# Patient Record
Sex: Male | Born: 2000 | Race: White | Hispanic: Yes | Marital: Single | Smoking: Light tobacco smoker
Health system: Southern US, Community
[De-identification: ages and names within clinical notes are randomized; demographics above are authoritative.]

## PROBLEM LIST (undated history)

## (undated) DIAGNOSIS — G809 Cerebral palsy, unspecified: Secondary | ICD-10-CM

## (undated) DIAGNOSIS — G919 Hydrocephalus, unspecified: Secondary | ICD-10-CM

## (undated) DIAGNOSIS — L509 Urticaria, unspecified: Secondary | ICD-10-CM

## (undated) HISTORY — PX: SHUNT REVISION: SHX343

## (undated) HISTORY — DX: Urticaria, unspecified: L50.9

---
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
## 2020-01-01 IMAGING — CT CT HEAD WITHOUT CONTRAST
4 series · 15 of 47 positions shown, 17 images · non-contrast
Comparison: 05/14/2014

CLINICAL DATA: Intermittent headache and neck neck soreness.

EXAM:
CT HEAD WITHOUT CONTRAST
TECHNIQUE: Contiguous axial images were obtained from the base of the skull
through the vertex without intravenous contrast.

[Series 3: head without · axial · non-contrast · 0.46mm/px · z∈[+1218,+1343]mm · 7 of 35 slices shown, 9 images]
[im 5/35  brain]
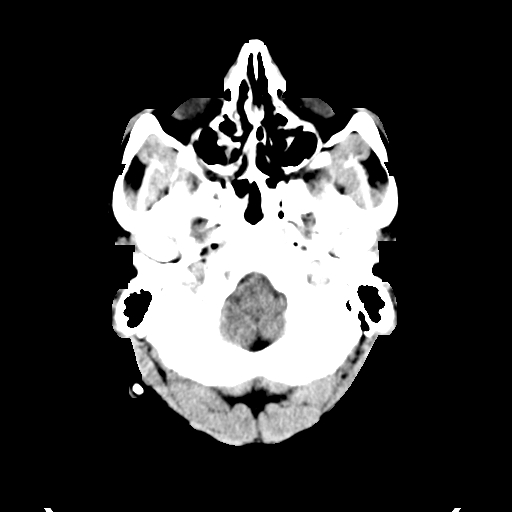
[im 5/35  bone]
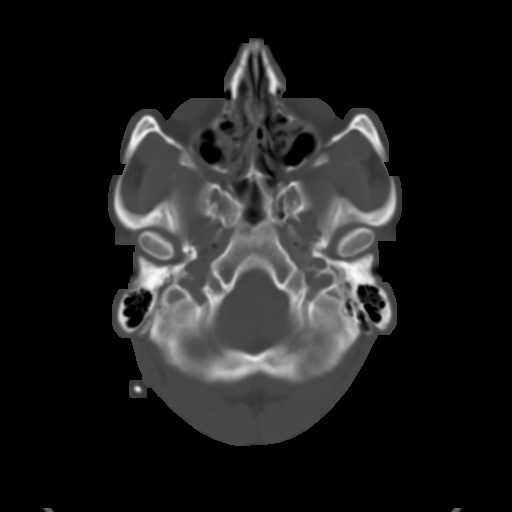
[im 9/35  brain]
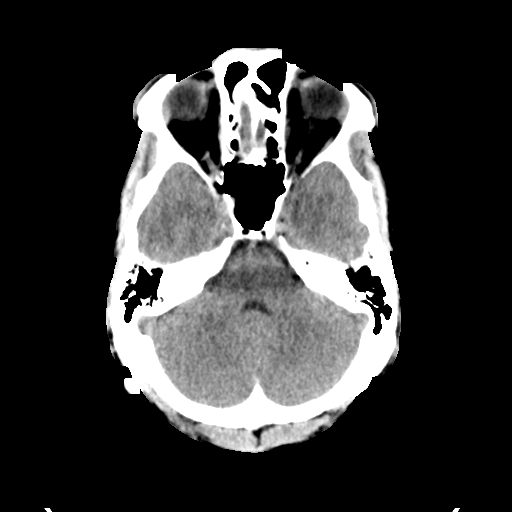
[im 13/35  brain]
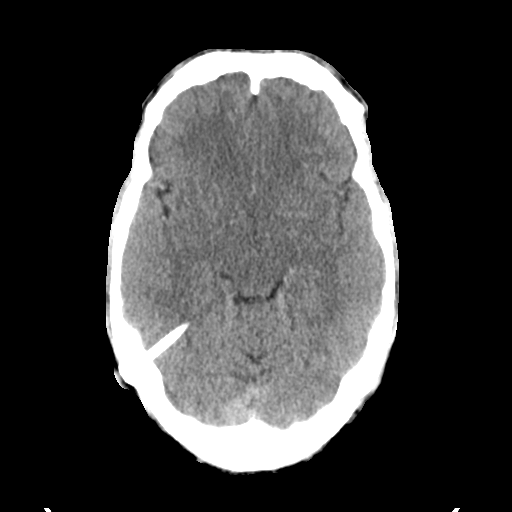
[im 18/35  brain]
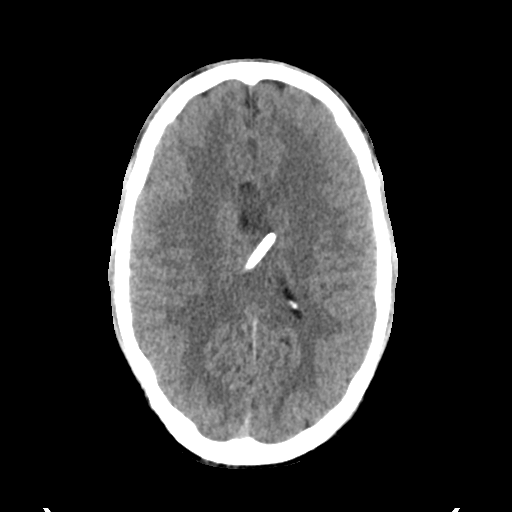
[im 22/35  brain]
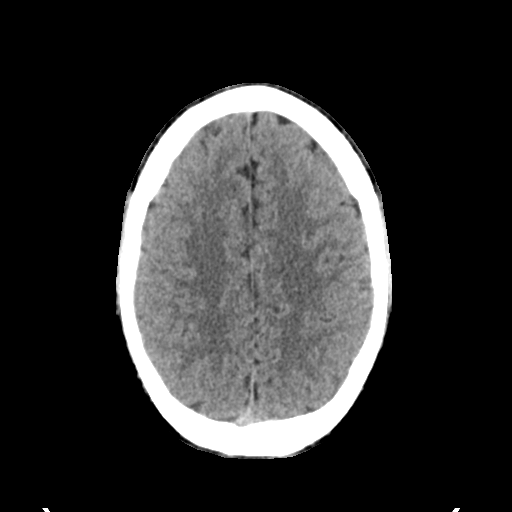
[im 22/35  bone]
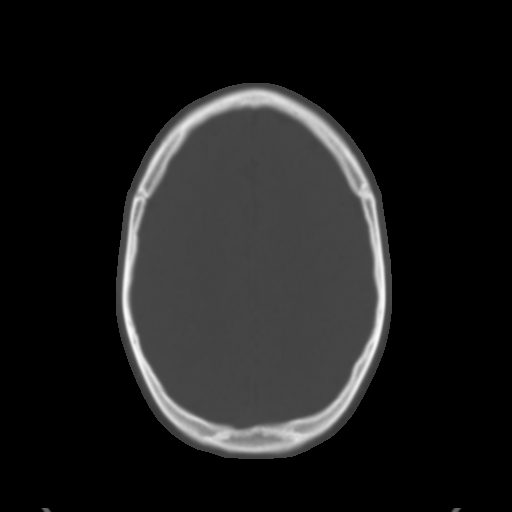
[im 26/35  brain]
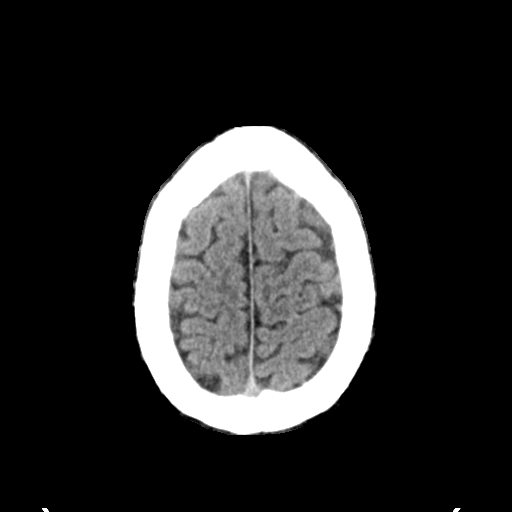
[im 30/35  brain]
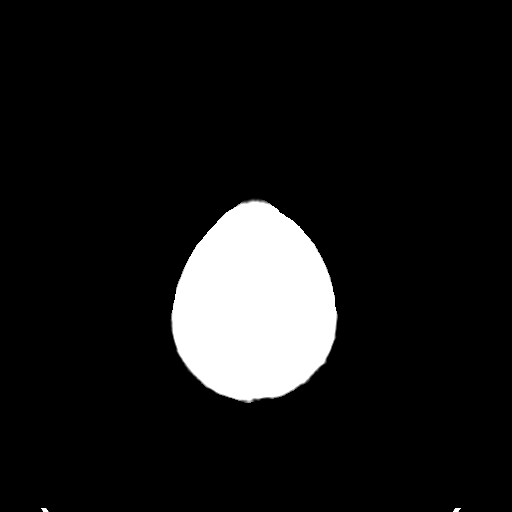

[Series 4: head bone · axial · 0.46mm/px · z∈[+1214,+1232]mm · 2 of 86 slices shown]
[im 9/86  bone]
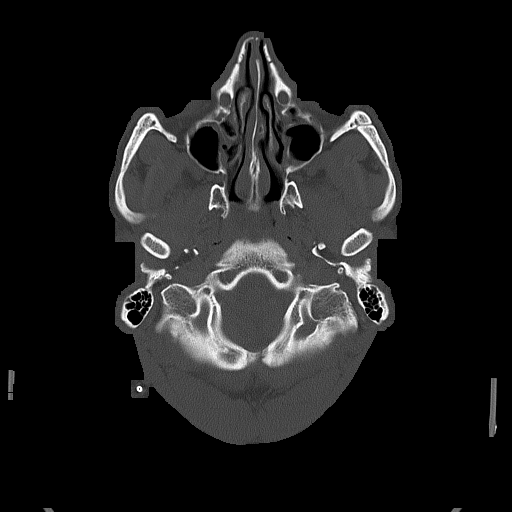
[im 18/86  bone]
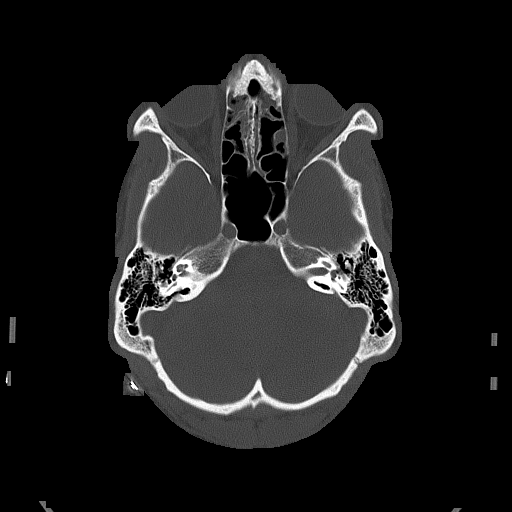

[Series 5: head without cor · coronal · non-contrast · 0.33mm/px · 3 of 77 slices shown]
[im 26/77  brain]
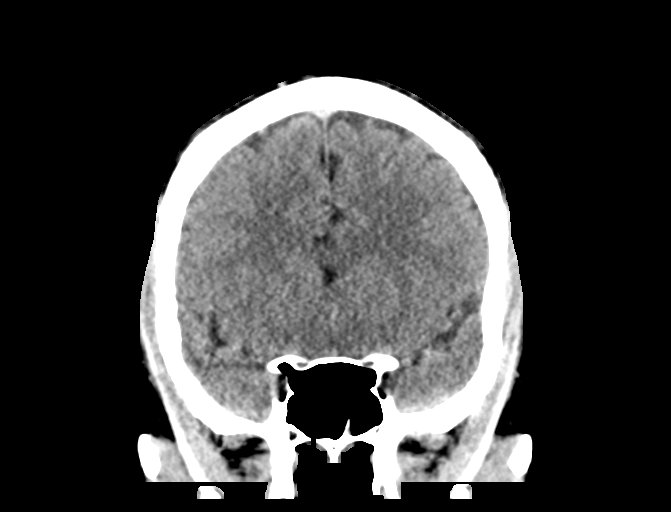
[im 34/77  brain]
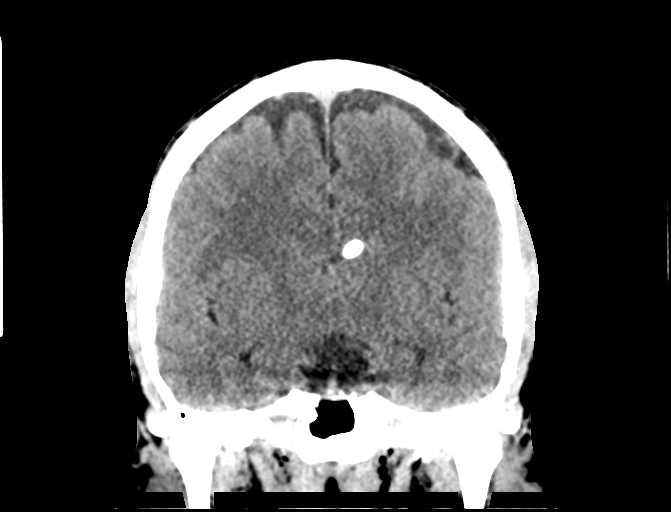
[im 43/77  brain]
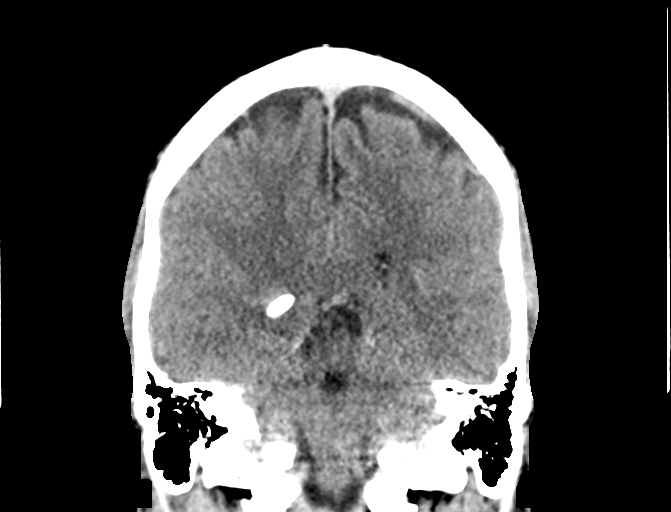

[Series 6: head without sag · sagittal · non-contrast · 0.33mm/px · 3 of 66 slices shown]
[im 22/66  brain]
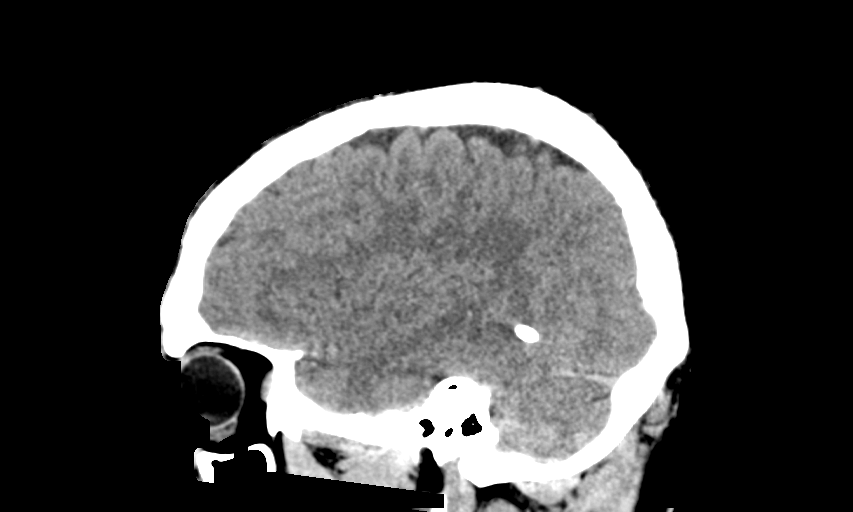
[im 33/66  brain]
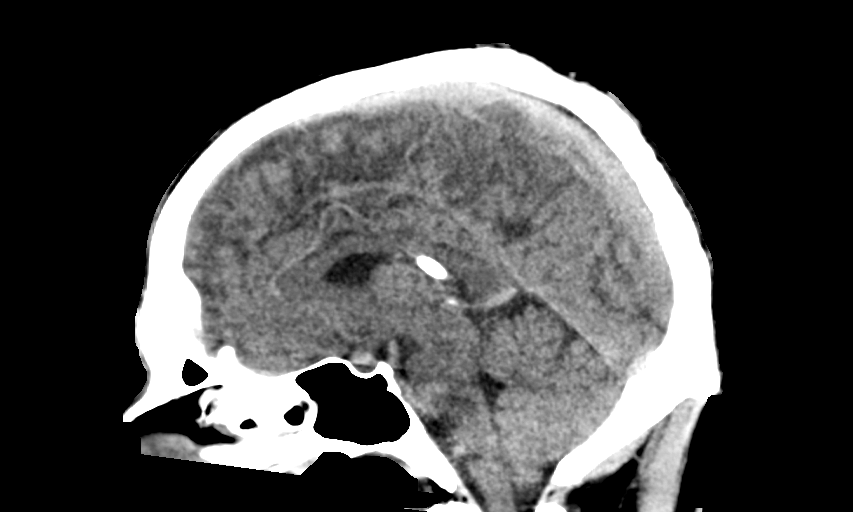
[im 44/66  brain]
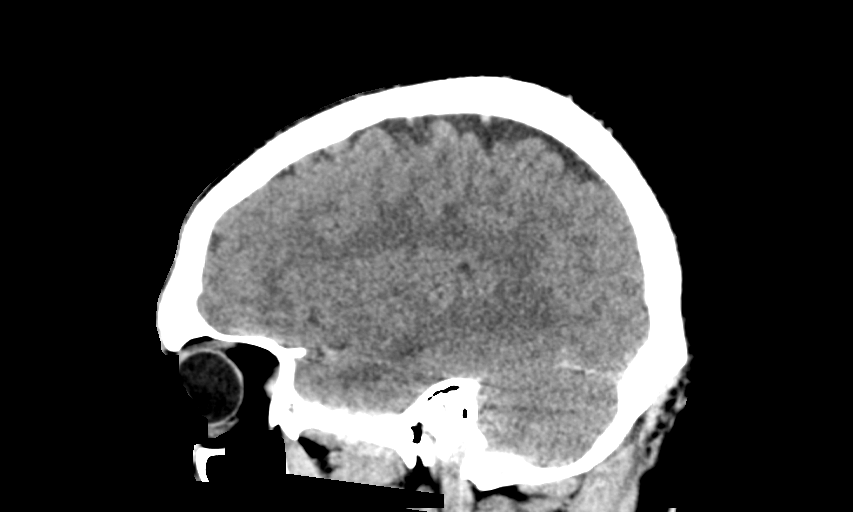

[15 of 47 positions shown; findings below may reference images not displayed]

FINDINGS: Brain: No evidence of acute infarction, hemorrhage, hydrocephalus,
extra-axial collection or mass lesion/mass effect. One of the
ventriculostomy catheters has been removed. The second catheter is
in stable position. The ventricles are decompressed as before.

Vascular: No hyperdense vessel or unexpected calcification.

Skull: Normal. Negative for fracture or focal lesion.

Sinuses/Orbits: Diffuse polypoid mucosal thickening of the ethmoid
sinuses and less so maxillary sinuses.

Other: None.
IMPRESSION: 1. No acute intracranial abnormality.
2. Interval removal of one of the ventriculostomy catheters. The
ventricles are decompressed as before.
3. Ethmoid and maxillary sinusitis.

## 2021-02-10 ENCOUNTER — Other Ambulatory Visit: Payer: Self-pay

## 2021-02-10 ENCOUNTER — Emergency Department (HOSPITAL_COMMUNITY): Payer: Medicaid Other

## 2021-02-10 ENCOUNTER — Encounter (HOSPITAL_COMMUNITY): Payer: Self-pay | Admitting: *Deleted

## 2021-02-10 ENCOUNTER — Emergency Department (HOSPITAL_COMMUNITY)
Admission: EM | Admit: 2021-02-10 | Discharge: 2021-02-10 | Disposition: A | Discharge status: Home / Self Care | Payer: Medicaid Other | Attending: Emergency Medicine | Admitting: Emergency Medicine

## 2021-02-10 DIAGNOSIS — S6992XA Unspecified injury of left wrist, hand and finger(s), initial encounter: Secondary | ICD-10-CM | POA: Diagnosis present

## 2021-02-10 DIAGNOSIS — S61217A Laceration without foreign body of left little finger without damage to nail, initial encounter: Secondary | ICD-10-CM | POA: Insufficient documentation

## 2021-02-10 DIAGNOSIS — W275XXA Contact with paper-cutter, initial encounter: Secondary | ICD-10-CM | POA: Insufficient documentation

## 2021-02-10 DIAGNOSIS — Y990 Civilian activity done for income or pay: Secondary | ICD-10-CM | POA: Diagnosis not present

## 2021-02-10 DIAGNOSIS — S61215A Laceration without foreign body of left ring finger without damage to nail, initial encounter: Secondary | ICD-10-CM | POA: Diagnosis not present

## 2021-02-10 DIAGNOSIS — Z5321 Procedure and treatment not carried out due to patient leaving prior to being seen by health care provider: Secondary | ICD-10-CM | POA: Insufficient documentation

## 2021-02-10 MED ORDER — TETANUS-DIPHTH-ACELL PERTUSSIS 5-2.5-18.5 LF-MCG/0.5 IM SUSY: 0.5000 mL | PREFILLED_SYRINGE | Freq: Once | INTRAMUSCULAR | Status: DC

## 2021-02-10 NOTE — ED Triage Notes (Signed)
Pt reports cutting left ring and little finger with machine at work. Swelling noted and abrasions. Bleeding controlled. Last tetanus > 103yrs.

## 2021-02-10 NOTE — ED Provider Triage Note (Signed)
Emergency Medicine Provider Triage Evaluation Note  Troy Randolph , a 21 y.o. male  was evaluated in triage.  Pt complains of left hand finger lacerations 12 hours ago (7/8pm 2/8). Cut with box cutter. Not bleeding at this time. No other cuts. No numbness or weakness he states  Review of Systems  Positive: laceration Negative: Fever   Physical Exam  BP (!) 148/89 (BP Location: Right Arm)    Pulse (!) 110    Temp 98.7 F (37.1 C) (Oral)    Resp 17    SpO2 98%  Gen:   Awake, no distress   Resp:  Normal effort  MSK:   Moves extremities without difficulty  Other:  Left hand 4th and 5th finger with laceration + skin avulsion  Medical Decision Making  Medically screening exam initiated at 8:12 AM.  Appropriate orders placed.  Troy Randolph was informed that the remainder of the evaluation will be completed by another provider, this initial triage assessment does not replace that evaluation, and the importance of remaining in the ED until their evaluation is complete.  TDAP will need updating. Xray.    Troy Randolph, [place] 02/10/21 [phone]

## 2021-02-11 ENCOUNTER — Ambulatory Visit (HOSPITAL_COMMUNITY)
Admission: EM | Admit: 2021-02-11 | Discharge: 2021-02-11 | Disposition: A | Discharge status: Home / Self Care | Payer: Medicaid Other | Attending: Student | Admitting: Student

## 2021-02-11 ENCOUNTER — Other Ambulatory Visit: Payer: Self-pay

## 2021-02-11 ENCOUNTER — Encounter (HOSPITAL_COMMUNITY): Payer: Self-pay | Admitting: Emergency Medicine

## 2021-02-11 DIAGNOSIS — T148XXA Other injury of unspecified body region, initial encounter: Secondary | ICD-10-CM

## 2021-02-11 DIAGNOSIS — Z23 Encounter for immunization: Secondary | ICD-10-CM | POA: Diagnosis not present

## 2021-02-11 MED ORDER — TETANUS-DIPHTH-ACELL PERTUSSIS 5-2.5-18.5 LF-MCG/0.5 IM SUSY
0.5000 mL | PREFILLED_SYRINGE | Freq: Once | INTRAMUSCULAR | Status: AC
  Administered 2021-02-11: 0.5 mL via INTRAMUSCULAR

## 2021-02-11 MED ORDER — CEPHALEXIN 500 MG PO CAPS: 500.0000 mg | ORAL_CAPSULE | Freq: Four times a day (QID) | ORAL | 0 refills | Status: AC

## 2021-02-11 MED ORDER — TETANUS-DIPHTH-ACELL PERTUSSIS 5-2.5-18.5 LF-MCG/0.5 IM SUSY
PREFILLED_SYRINGE | INTRAMUSCULAR | Status: AC
  Filled 2021-02-11: qty 0.5

## 2021-02-11 NOTE — ED Provider Notes (Signed)
[facility]    CSN: [id number] Arrival date & time: 02/11/21  0900      History   Chief Complaint Chief Complaint  Patient presents with   Finger Injury    HPI Troy Randolph is a 21 y.o. male presenting with skin avulsion for 2 days - L hand.  Medical history noncontributory.  States that he works in [employer], and a machine that he was using caught on his fingers and tore the skin away.  States that this is not painful at all, but the bleeding was significant, so he presented to the emergency department.  He did not stay to be seen.  Has been caring for this using hydrogen peroxide daily.  States he can move his fingers without issue.  He is left-handed.  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 Authorizing Provider  cephALEXin (KEFLEX) 500 MG capsule Take 1 capsule (500 mg total) by mouth 4 (four) times daily. 02/11/21  Yes Rhys Martini, PA-C    Family History Family History  Problem Relation Age of Onset   Healthy Mother    Healthy Father     Social History Social History   Tobacco Use   Smoking status: Light Smoker   Smokeless tobacco: Never  Vaping Use   Vaping Use: Never used  Substance Use Topics   Alcohol use: Yes   Drug use: No     Allergies   Patient has no known allergies.   Review of Systems Review of Systems  Skin:  Positive for wound.  All other systems reviewed and are negative.   Physical Exam Triage Vital Signs ED Triage Vitals  Enc Vitals Group     BP 02/11/21 0958  126/76     Pulse Rate 02/11/21 0958 78     Resp 02/11/21 0958 16     Temp 02/11/21 0958 98.3 F (36.8 C)     Temp Source 02/11/21 0958 Oral     SpO2 02/11/21 0958 100 %     Weight 02/11/21 0957 164 lb 14.5 oz (74.8 kg)     Height 02/11/21 0957 5\' 8"  (1.727 m)     Head Circumference --      Peak Flow --      Pain Score 02/11/21 0957 0     Pain Loc --      Pain Edu? --      Excl. in GC? --    No data found.  Updated Vital Signs BP 126/76 (BP Location: Left Arm)    Pulse 78    Temp 98.3 F (36.8 C) (Oral)    Resp 16    Ht 5\' 8"  (1.727 m)    Wt 164 lb 14.5 oz (74.8 kg)    SpO2 100%    BMI 25.07 kg/m   Visual Acuity Right Eye Distance:   Left Eye Distance:   Bilateral Distance:    Right Eye Near:   Left Eye Near:  Bilateral Near:     Physical Exam Vitals reviewed.  Constitutional:      General: He is not in acute distress.    Appearance: Normal appearance. He is not ill-appearing.  HENT:     Head: Normocephalic and atraumatic.  Pulmonary:     Effort: Pulmonary effort is normal.  Skin:    Findings: Wound present.     Comments: See images below L little finger with skin avulsion overlying dorsal PIP joint. NO tenderness of the wound or of PIP/DIP joint. Cap refill <2 seconds.  L ring finger with two skin avulsions. One 25mm avulsion overlying palmar PIP joint. One small avulsion lateral aspect of PIP joint. NO tenderness of the wound or of PIP/DIP joint. Cap refill <2 seconds.  Grip strength 4/5 and can make a fist without pain. Radial pulse 2+. Bleeding is controlled.  Neurological:     General: No focal deficit present.     Mental Status: He is alert and oriented to person, place, and time.  Psychiatric:        Mood and Affect: Mood normal.        Behavior: Behavior normal.        Thought Content: Thought content normal.        Judgment: Judgment normal.        UC Treatments / Results  Labs (all labs ordered are listed, but only abnormal results are  displayed) Labs Reviewed - No data to display  EKG   Radiology DG Hand Complete Left  Result Date: 02/10/2021 CLINICAL DATA:  lacerations of 4th/5th finger EXAM: LEFT HAND - COMPLETE 3+ VIEW COMPARISON:  Left hand radiograph 03/16/2011 FINDINGS: There is no evidence of acute fracture or dislocation. There are soft tissue injuries of the fourth and fifth digits. There are visible punctate radiopaque densities overlying the volar radial soft tissues of the fourth and fifth digits at the level of the middle phalanges. IMPRESSION: Soft tissue injuries of the fourth and fifth digits, with visible radiopaque densities along the volar radial soft tissues at the level of the middle phalanges, possibly foreign bodies. No evidence of underlying acute fracture. Electronically Signed   By: Caprice Renshaw M.D.   On: 02/10/2021 08:54    Procedures Procedures (including critical care time)  Medications Ordered in UC Medications  Tdap (BOOSTRIX) injection 0.5 mL (has no administration in time range)    Initial Impression / Assessment and Plan / UC Course  I have reviewed the triage vital signs and the nursing notes.  Pertinent labs & imaging results that were available during my care of the patient were reviewed by me and considered in my medical decision making (see chart for details).     This patient is a very pleasant 21 y.o. year old male presenting with multiple skin avulsions L hand x2 days. Afebrile, nontachy, neurovascularly intact.   Wounds present x2 days, no active bleeding.  Tdap administered. There is no bony tenderness, ROM fingers is intact. Patient declines xray which I am in agreement with.  Keflex sent given extent of wound and patient has not washed this since injury (has been using hydrogen peroxide).   ED return precautions discussed. Patient verbalizes understanding and agreement.    Final Clinical Impressions(s) / UC Diagnoses   Final diagnoses:  Skin avulsion  Need for  Tdap vaccination     Discharge Instructions      -Start the antibiotic: Keflex, 4x daily x5 days. You can take this with food if you  have a sensitive stomach. -Wash your wound with gentle soap and water 1-2 times daily.  Let air dry or gently pat. You can follow with over-the-counter neosporin ointment (or similar). Keep wrapped during the day or when you're doing something that could get it dirty (working, [employer], cooking, Catering manager). Avoid cleansing with hydrogen peroxide or alcohol!! -Seek additional medical attention if the wound is getting worse instead of better- redness increasing in size, pain getting worse, new/worsening discharge, new fevers/chills, etc.    ED Prescriptions     Medication Sig Dispense Auth. Provider   cephALEXin (KEFLEX) 500 MG capsule Take 1 capsule (500 mg total) by mouth 4 (four) times daily. 20 capsule Rhys Martini, PA-C      PDMP not reviewed this encounter.   Rhys Martini, PA-C 02/11/21 1035

## 2021-02-11 NOTE — ED Triage Notes (Signed)
Pt reports cutting left ring and pinky finger with machine at work. Pt went to [facility] ED yesterday and left due to long wait times.  Bleeding controlled.

## 2021-02-11 NOTE — Discharge Instructions (Addendum)
-  Start the antibiotic: Keflex, 4x daily x5 days. You can take this with food if you have a sensitive stomach. ?-Wash your wound with gentle soap and water 1-2 times daily.  Let air dry or gently pat. You can follow with over-the-counter neosporin ointment (or similar). Keep wrapped during the day or when you're doing something that could get it dirty (working, yardwork, cooking, etc). Avoid cleansing with hydrogen peroxide or alcohol!! ?-Seek additional medical attention if the wound is getting worse instead of better- redness increasing in size, pain getting worse, new/worsening discharge, new fevers/chills, etc. ? ?

## 2022-11-21 IMAGING — DX DG HAND COMPLETE 3+V*L*
3 series · 3 of 3 positions shown · non-contrast
Comparison: Left hand radiograph 03/16/2011

CLINICAL DATA: lacerations of 4th/5th finger

EXAM:
LEFT HAND - COMPLETE 3+ VIEW

[x hand pa left]
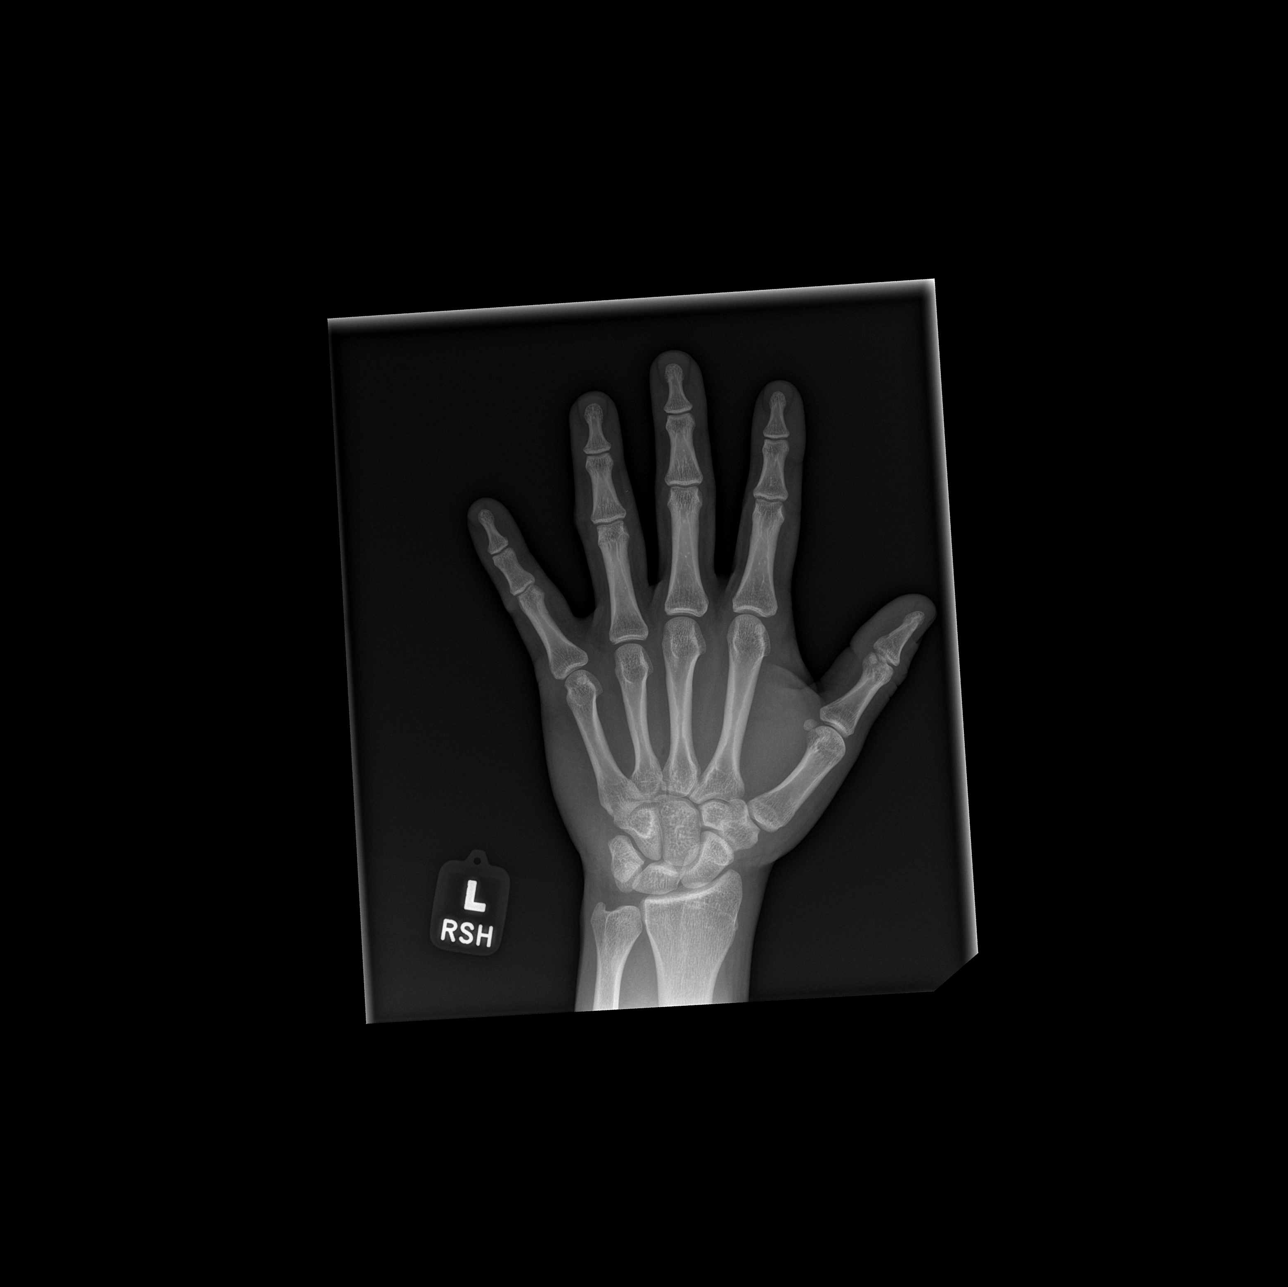

[x hand obl left]
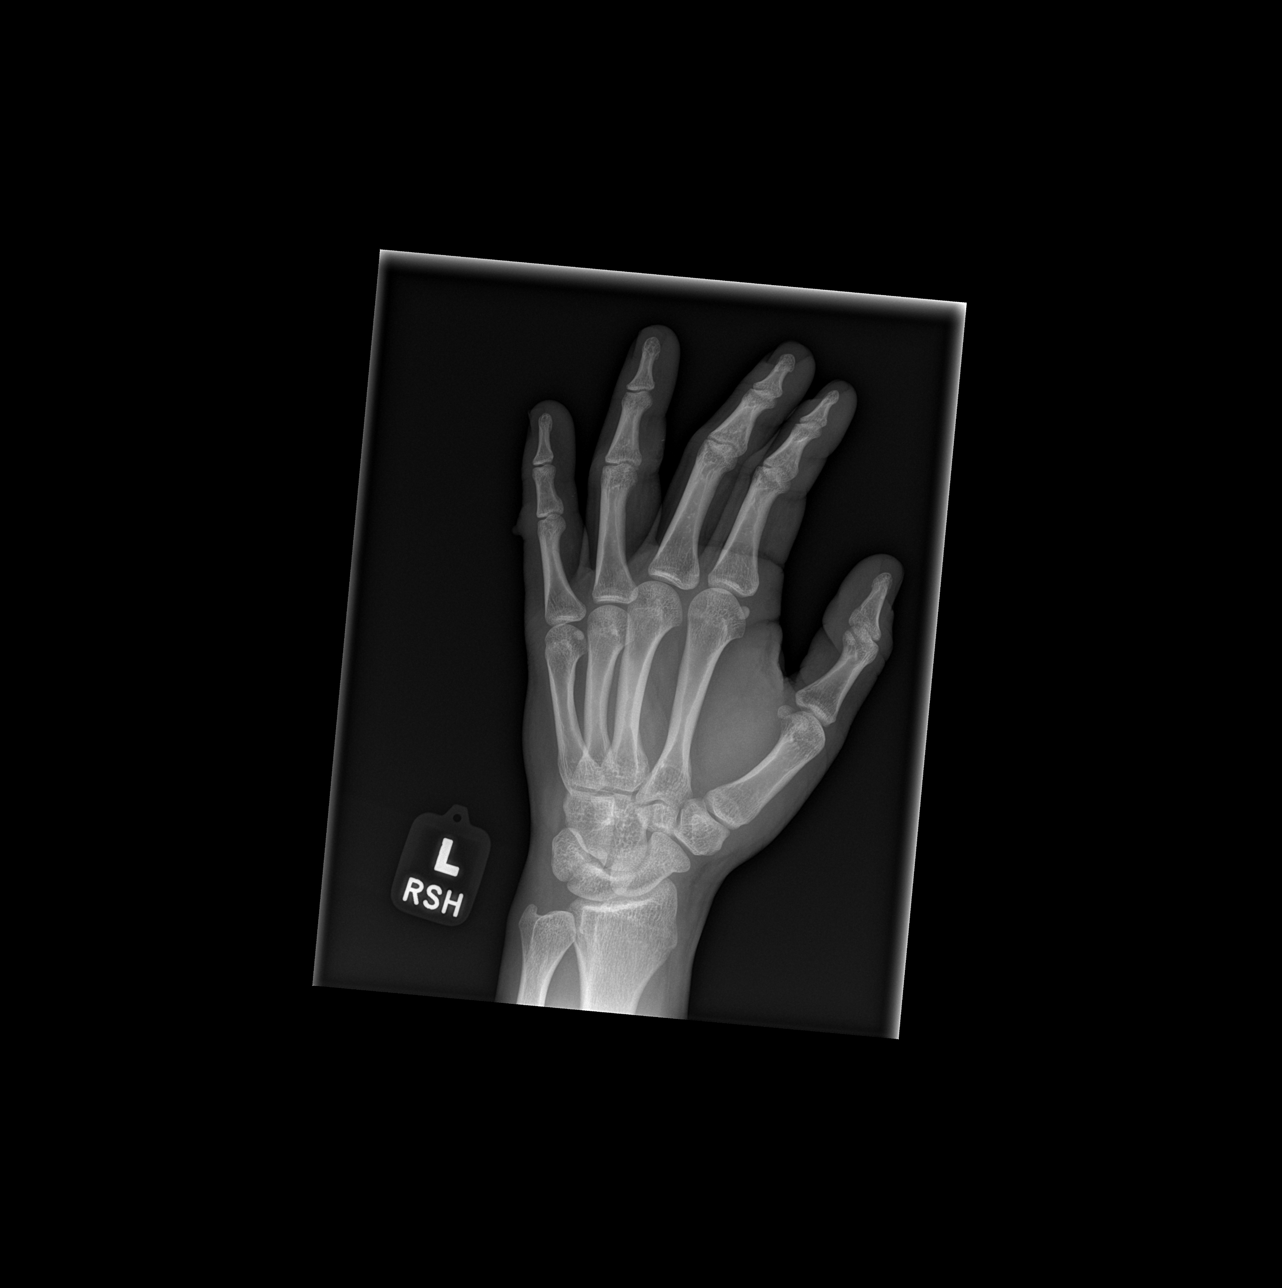

[x hand lat left]
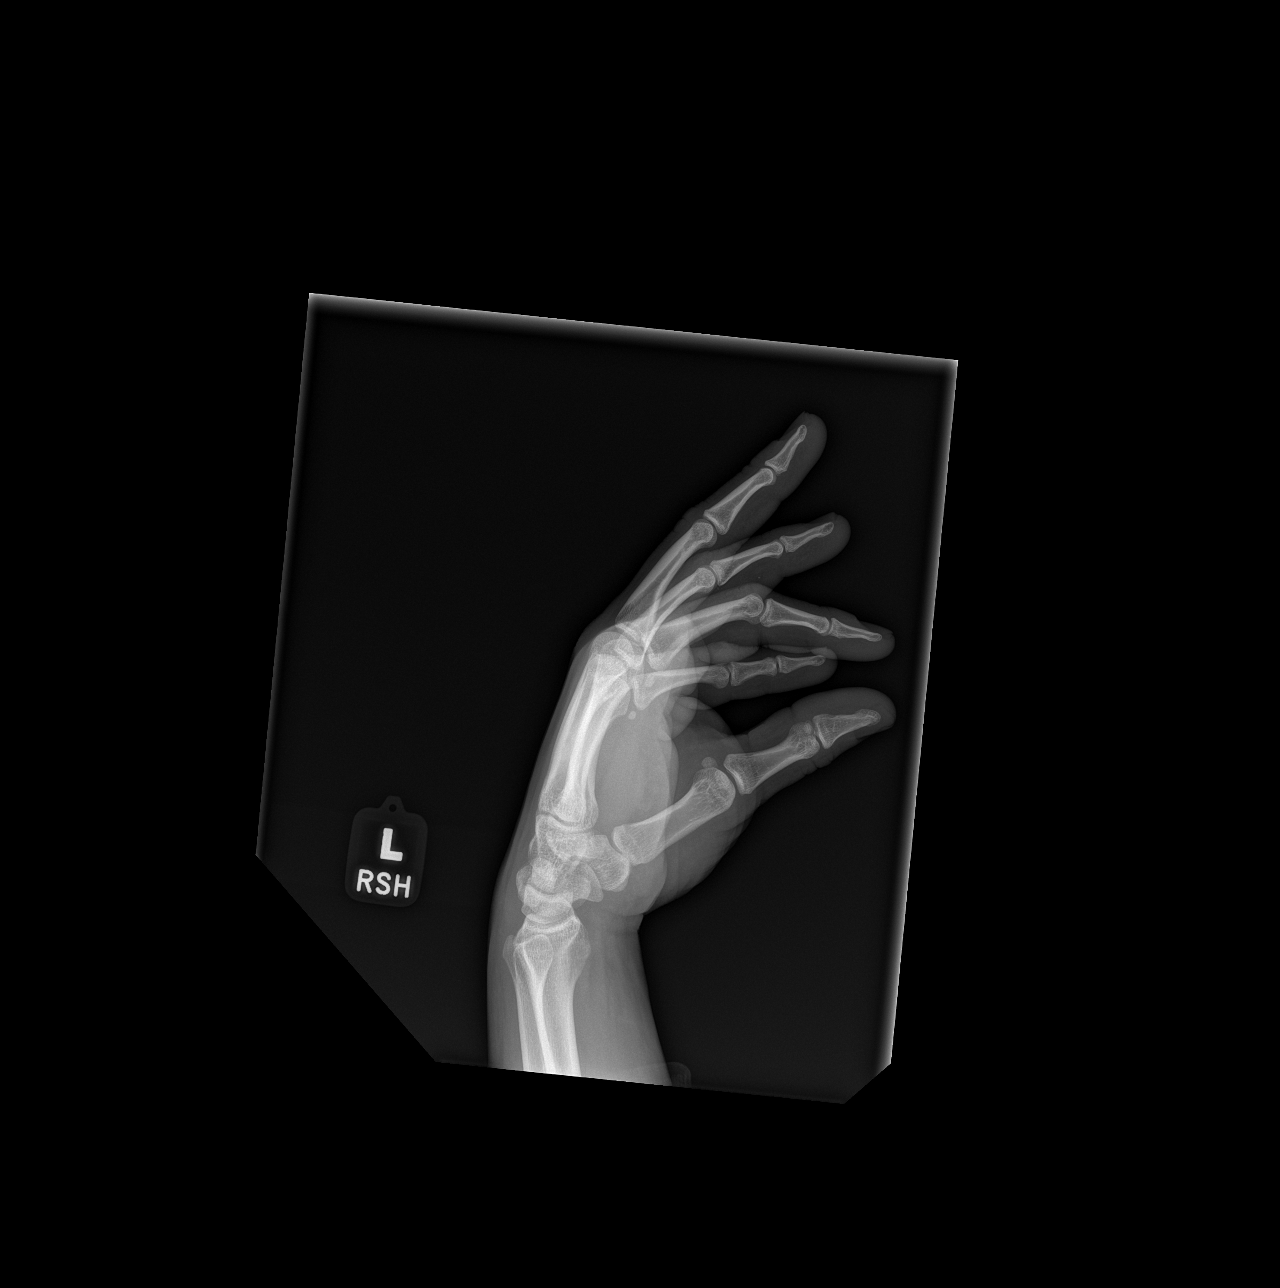

[3 of 3 positions shown; findings below may reference images not displayed]

FINDINGS: There is no evidence of acute fracture or dislocation. There are
soft tissue injuries of the fourth and fifth digits. There are
visible punctate radiopaque densities overlying the volar radial
soft tissues of the fourth and fifth digits at the level of the
middle phalanges.
IMPRESSION: Soft tissue injuries of the fourth and fifth digits, with visible
radiopaque densities along the volar radial soft tissues at the
level of the middle phalanges, possibly foreign bodies. No evidence
of underlying acute fracture.
